# Patient Record
Sex: Female | Born: 1970 | Race: White | Hispanic: No | Marital: Married | Smoking: Never smoker
Health system: Southern US, Community
[De-identification: ages and names within clinical notes are randomized; demographics above are authoritative.]

---

## 2007-11-25 ENCOUNTER — Emergency Department: Payer: Self-pay | Admitting: Emergency Medicine

## 2007-11-28 ENCOUNTER — Ambulatory Visit: Payer: Self-pay | Admitting: Surgery

## 2007-11-29 ENCOUNTER — Ambulatory Visit: Payer: Self-pay | Admitting: Surgery

## 2007-12-03 ENCOUNTER — Ambulatory Visit: Payer: Self-pay | Admitting: Surgery

## 2011-07-17 ENCOUNTER — Ambulatory Visit: Payer: Self-pay | Admitting: Emergency Medicine

## 2015-07-03 DIAGNOSIS — R309 Painful micturition, unspecified: Secondary | ICD-10-CM | POA: Insufficient documentation

## 2015-07-03 DIAGNOSIS — R3911 Hesitancy of micturition: Secondary | ICD-10-CM | POA: Insufficient documentation

## 2017-07-06 ENCOUNTER — Ambulatory Visit: Payer: Self-pay | Admitting: Family Medicine

## 2017-07-06 VITALS — BP 130/62 | HR 85 | Temp 98.2°F | Resp 18 | Ht 64.0 in | Wt 170.0 lb

## 2017-07-06 DIAGNOSIS — Z008 Encounter for other general examination: Secondary | ICD-10-CM

## 2017-07-06 DIAGNOSIS — Z0189 Encounter for other specified special examinations: Principal | ICD-10-CM

## 2017-07-06 LAB — GLUCOSE, POCT (MANUAL RESULT ENTRY): POC Glucose: 99 mg/dL (ref 70–99)

## 2017-07-06 NOTE — Progress Notes (Signed)
Subjective: Annual biometrics screening  Patient presents for her annual biometric screening. Patient has a history of allergic rhinitis.  Patient reports generally eating a well-balanced diet and doing yoga for exercise but that she occasionally eats too much when she does not sleep well.  Patient reports she is trying to work on improving this currently. PCP: Patient's PCP just retired. Patient denies any other issues or concerns.   Review of Systems Unremarkable  Objective  Physical Exam General: Awake, alert and oriented. No acute distress. Well developed, hydrated and nourished. Appears stated age.  HEENT: Supple neck without adenopathy. Sclera is non-icteric. The ear canal is clear without discharge. The tympanic membrane is normal in appearance with normal landmarks and cone of light. Nasal mucosa is pink and moist. Oral mucosa is pink and moist. The pharynx is normal in appearance without tonsillar swelling or exudates.  Skin: Skin in warm, dry and intact without rashes or lesions. Appropriate color for ethnicity. Cardiac: Heart rate and rhythm are normal. No murmurs, gallops, or rubs are auscultated.  Respiratory: The chest wall is symmetric and without deformity. No signs of respiratory distress. Lung sounds are clear in all lobes bilaterally without rales, ronchi, or wheezes.  Neurological: The patient is awake, alert and oriented to person, place, and time with normal speech.  Memory is normal and thought processes intact. No gait abnormalities are appreciated.  Psychiatric: Appropriate mood and affect.   Assessment Annual biometrics screening  Plan  Lipid panel pending. Encouraged routine visits with primary care provider.  Provide patient with resources to establish care with a new primary care provider since her primary care provider recently left the practice. Encouraged eating a healthy, well-balanced diet and exercise. Fasting blood sugar is 99 today.

## 2017-07-07 LAB — LIPID PANEL
Chol/HDL Ratio: 5.2 ratio — ABNORMAL HIGH (ref 0.0–4.4)
Cholesterol, Total: 196 mg/dL (ref 100–199)
HDL: 38 mg/dL — ABNORMAL LOW
LDL Calculated: 136 mg/dL — ABNORMAL HIGH (ref 0–99)
Triglycerides: 111 mg/dL (ref 0–149)
VLDL Cholesterol Cal: 22 mg/dL (ref 5–40)

## 2017-07-09 NOTE — Progress Notes (Signed)
Carollee HerterShannon, Will you call the patient and inform them that their lipid panel came back?  Everything is normal with the exception of her HDL cholesterol, LDL cholesterol, and cholesterol/HDL ratio.  The HDL cholesterol ("good cholesterol") is decreased at 38, normal values are above 39.  The LDL cholesterol ("bad cholesterol") is increased at 136, normal values are below 99.  The cholesterol/HDL ratio is increased at 5.2, normal values are between 0 and 4.4 for women or 0 and 5 from men.  These abnormal values increase their risk for cardiovascular disease.  Please advise the patient to discuss this with their primary care provider at their next regularly scheduled visit.

## 2018-10-07 ENCOUNTER — Other Ambulatory Visit: Payer: Self-pay

## 2018-10-07 ENCOUNTER — Ambulatory Visit (INDEPENDENT_AMBULATORY_CARE_PROVIDER_SITE_OTHER): Payer: Managed Care, Other (non HMO) | Admitting: Urology

## 2018-10-07 ENCOUNTER — Encounter: Payer: Self-pay | Admitting: Urology

## 2018-10-07 ENCOUNTER — Encounter

## 2018-10-07 VITALS — BP 121/68 | Ht 64.0 in | Wt 170.0 lb

## 2018-10-07 DIAGNOSIS — N1330 Unspecified hydronephrosis: Secondary | ICD-10-CM | POA: Diagnosis not present

## 2018-10-07 DIAGNOSIS — R300 Dysuria: Secondary | ICD-10-CM

## 2018-10-07 LAB — MICROSCOPIC EXAMINATION
Bacteria, UA: NONE SEEN
RBC: NONE SEEN /hpf (ref 0–2)

## 2018-10-07 LAB — URINALYSIS, COMPLETE
Bilirubin, UA: NEGATIVE
Glucose, UA: NEGATIVE
Ketones, UA: NEGATIVE
Leukocytes,UA: NEGATIVE
Nitrite, UA: NEGATIVE
Protein,UA: NEGATIVE
RBC, UA: NEGATIVE
Specific Gravity, UA: 1.01 (ref 1.005–1.030)
Urobilinogen, Ur: 0.2 mg/dL (ref 0.2–1.0)
pH, UA: 6 (ref 5.0–7.5)

## 2018-10-07 NOTE — Progress Notes (Signed)
10/07/2018  11:51 AM   Julie Riddle April 07, 1970 [id number]  Referring provider: Nelwyn Salisbury, PA-C [place],  [place] [address]  Chief Complaint  Patient presents with  . Urinary Tract Infection    HPI: I was consulted to assess the patient possible bladder infections.  The details of the history were difficult.  She has been treated for 2 bladder infections in the last 2 months but the cultures were normal.  She took antibiotics once.  She primarily gets a stinging feeling or discomfort in the low back and sometimes in the urethra.  It is quite vague and may be worse when she holds it longer especially at night.  It temporarily lessens after she voids.  She does not get pelvic pain and she has intermittent dyspareunia.  It may be worse after each menstrual cycle.  She voids every 1 or 2 hours and usually gets up 2 or more times at night.  She has a poor flow with hesitancy but does feel empty and is continent  She denies a history of previous GU surgery kidney stones or bladder infections.  No neurologic issues.  She has not had a hysterectomy and bowel movements normal  Modifying factors: There are no other modifying factors  Associated signs and symptoms: There are no other associated signs and symptoms Aggravating and relieving factors: There are no other aggravating or relieving factors Severity: Moderate Duration: Persistent    PMH: No past medical history on file.  Surgical History:   Home Medications:  Allergies as of 10/07/2018      Reactions   Penicillins Hives      Medication List       Accurate as of October 07, 2018 11:51 AM. If you have any questions, ask your nurse or doctor.        cetirizine 10 MG tablet Commonly known as: ZYRTEC       Allergies:  Allergies  Allergen Reactions  . Penicillins Hives    Family History: No family history on file.  Social History:  reports that she has never smoked. She has never used  smokeless tobacco. No history on file for alcohol and drug.  ROS: UROLOGY Frequent Urination?: Yes Hard to postpone urination?: No Burning/pain with urination?: No Get up at night to urinate?: Yes Leakage of urine?: No Urine stream starts and stops?: Yes Trouble starting stream?: Yes Do you have to strain to urinate?: No Blood in urine?: No Urinary tract infection?: No Sexually transmitted disease?: No Injury to kidneys or bladder?: No Painful intercourse?: No Weak stream?: Yes Currently pregnant?: No Vaginal bleeding?: No Last menstrual period?: n  Gastrointestinal Nausea?: No Vomiting?: No Indigestion/heartburn?: No Diarrhea?: No Constipation?: No  Constitutional Fever: No Night sweats?: No Weight loss?: No Fatigue?: No  Skin Skin rash/lesions?: No Itching?: No  Eyes Blurred vision?: No Double vision?: No  Ears/Nose/Throat Sore throat?: No Sinus problems?: No  Hematologic/Lymphatic Swollen glands?: No Easy bruising?: No  Cardiovascular Leg swelling?: No Chest pain?: No  Respiratory Cough?: No Shortness of breath?: No  Endocrine Excessive thirst?: No  Musculoskeletal Back pain?: No Joint pain?: No  Neurological Headaches?: No Dizziness?: No  Psychologic Depression?: No Anxiety?: No  Physical Exam: BP 121/68   Ht 5\' 4"  (1.626 m)   Wt 170 lb (77.1 kg)   BMI 29.18 kg/m   Constitutional:  Alert and oriented, No acute distress. HEENT: [place] AT, moist mucus membranes.  Trachea midline, no masses. Cardiovascular: No clubbing, cyanosis, or  edema. Respiratory: Normal respiratory effort, no increased work of breathing. GI: Abdomen is soft, nontender, nondistended, no abdominal masses GU: No CVA tenderness.  No bladder tenderness.  On pelvic examination she had a grade 1 cystocele but no levator tenderness or bladder tenderness and no stress incontinence Skin: No rashes, bruises or suspicious lesions. Lymph: No cervical or inguinal adenopathy.  Neurologic: Grossly intact, no focal deficits, moving all 4 extremities. Psychiatric: Normal mood and affect.  Laboratory Data: No results found for: WBC, HGB, HCT, MCV, PLT  No results found for: CREATININE  No results found for: PSA  No results found for: TESTOSTERONE  No results found for: HGBA1C  Urinalysis No results found for: COLORURINE, APPEARANCEUR, LABSPEC, PHURINE, GLUCOSEU, HGBUR, BILIRUBINUR, KETONESUR, PROTEINUR, UROBILINOGEN, NITRITE, LEUKOCYTESUR  Pertinent Imaging:   Assessment & Plan: The patient has vague primarily left low back symptoms that may be somewhat intermittent or cyclical.  She has had 2- urine cultures and urine was sent today also but was normal.  I do not think she has interstitial cystitis.  I think if her renal ultrasound is normal the genitourinary system is not the cause of her symptoms.  With the cyclical nature endometriosis might be a distant option.  The role of a CT scan also discussed  We will call her with the CT results.  There are no diagnoses linked to this encounter.  No follow-ups on file.  Martina SinnerScott A Lacoya Wilbanks, MD  [facility] [address], [address] [place], [place] [address] [phone]

## 2018-10-09 LAB — URINE CULTURE: Organism ID, Bacteria: NO GROWTH

## 2018-10-18 ENCOUNTER — Ambulatory Visit: Payer: Managed Care, Other (non HMO)

## 2018-10-28 ENCOUNTER — Other Ambulatory Visit: Payer: Self-pay

## 2018-10-28 ENCOUNTER — Ambulatory Visit: Payer: Self-pay | Admitting: Urology

## 2018-10-28 ENCOUNTER — Ambulatory Visit
Admission: RE | Admit: 2018-10-28 | Discharge: 2018-10-28 | Disposition: A | Discharge status: Home / Self Care | Payer: Managed Care, Other (non HMO) | Source: Ambulatory Visit | Attending: Urology | Admitting: Urology

## 2018-10-28 DIAGNOSIS — N1330 Unspecified hydronephrosis: Secondary | ICD-10-CM | POA: Diagnosis not present

## 2019-11-10 ENCOUNTER — Other Ambulatory Visit: Payer: Self-pay | Admitting: Medical Oncology

## 2019-11-10 DIAGNOSIS — Z1231 Encounter for screening mammogram for malignant neoplasm of breast: Secondary | ICD-10-CM

## 2020-05-21 IMAGING — CT CT ABDOMEN AND PELVIS WITHOUT CONTRAST
2 of 4 series · 16 of 46 positions shown, 18 images · non-contrast
Comparison: 07/17/2011 abdominal ultrasound. Report of an abdominal
CT of 11/16/2006.

CLINICAL DATA: Dysuria and urinary frequency for 3 months. Left
flank pain.

EXAM:
CT ABDOMEN AND PELVIS WITHOUT CONTRAST
TECHNIQUE: Multidetector CT imaging of the abdomen and pelvis was performed
following the standard protocol without IV contrast.

[Series 2: routine abd pelvis without · axial · non-contrast · 0.64mm/px · z∈[-1507,-1107]mm · 13 of 89 slices shown, 15 images]
[im 5/89  soft-tissue]
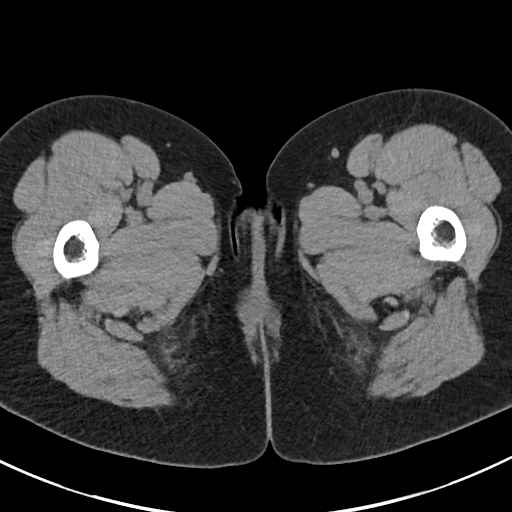
[im 5/89  bone]
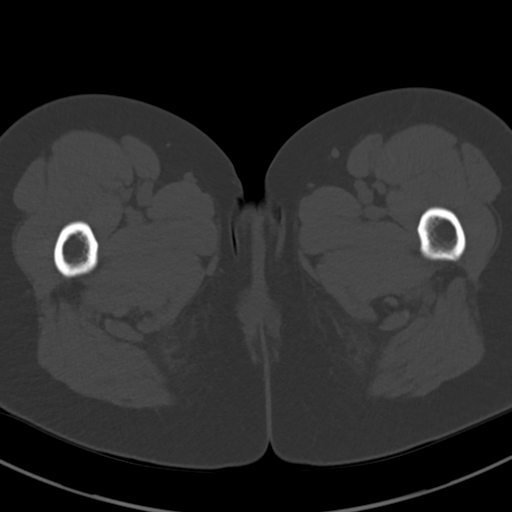
[im 13/89  soft-tissue]
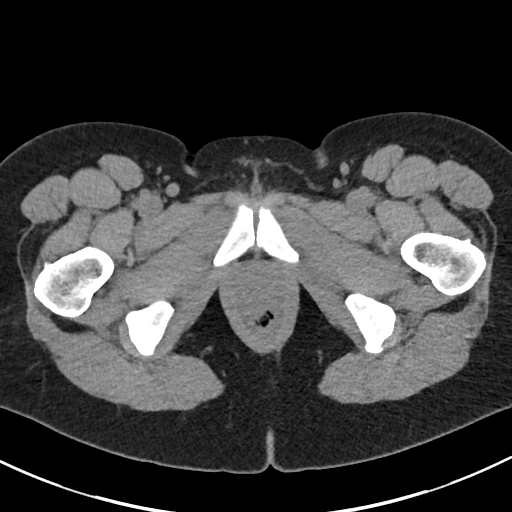
[im 21/89  soft-tissue]
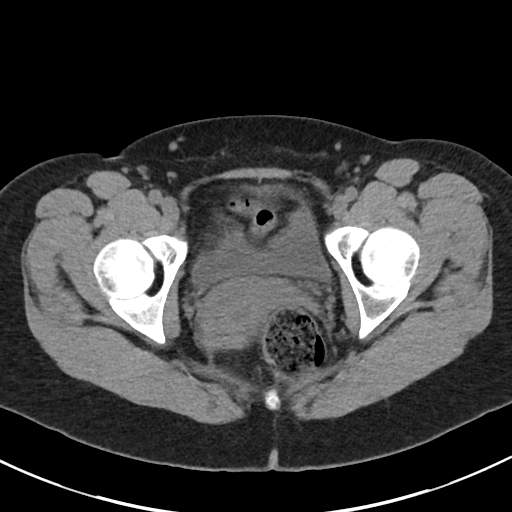
[im 25/89  soft-tissue]
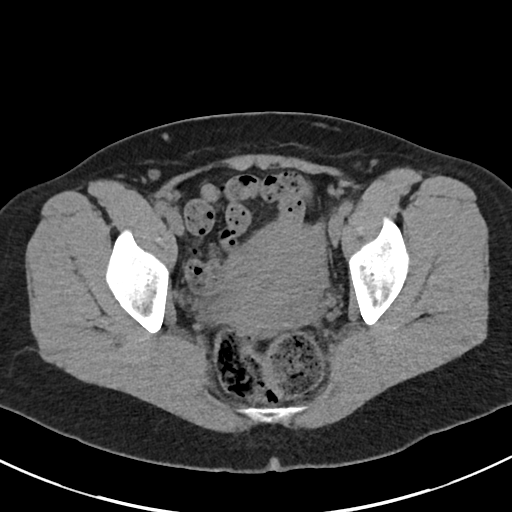
[im 33/89  soft-tissue]
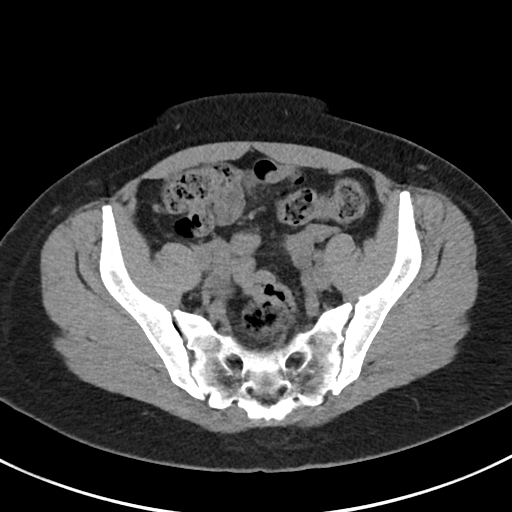
[im 37/89  soft-tissue]
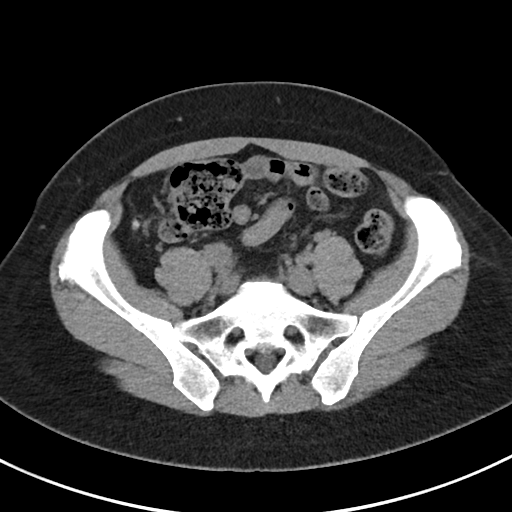
[im 45/89  soft-tissue]
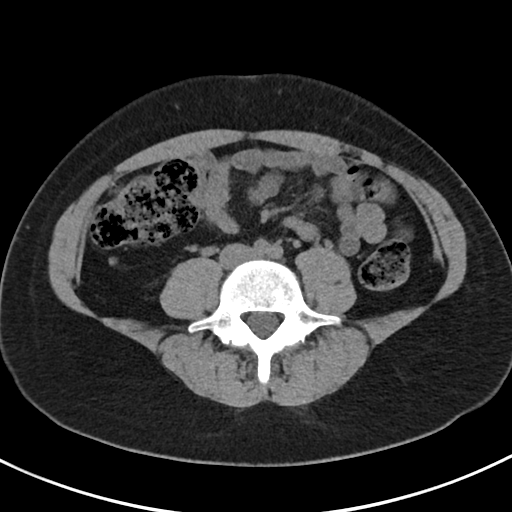
[im 53/89  soft-tissue]
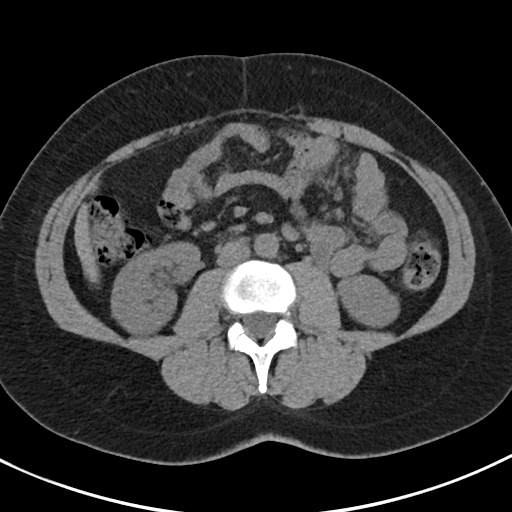
[im 57/89  soft-tissue]
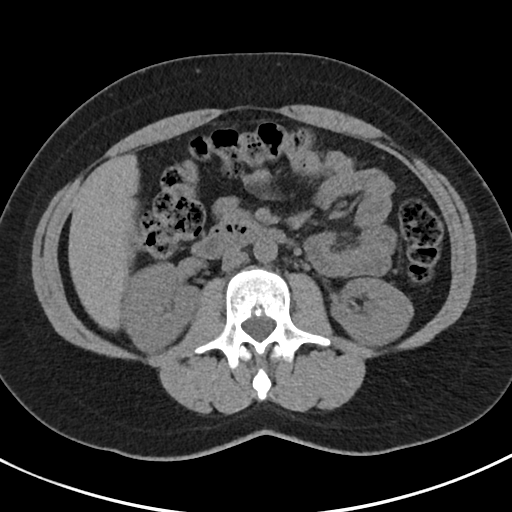
[im 57/89  bone]
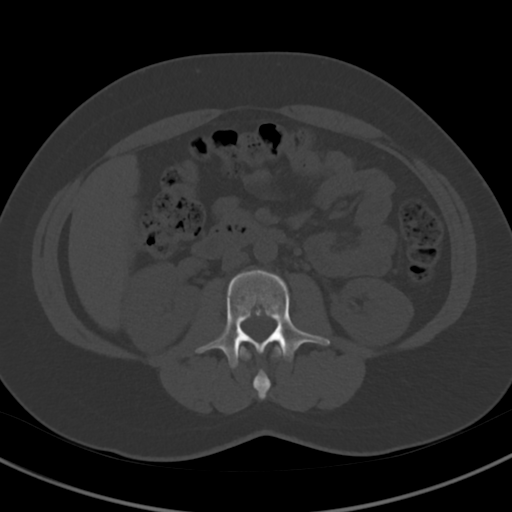
[im 65/89  soft-tissue]
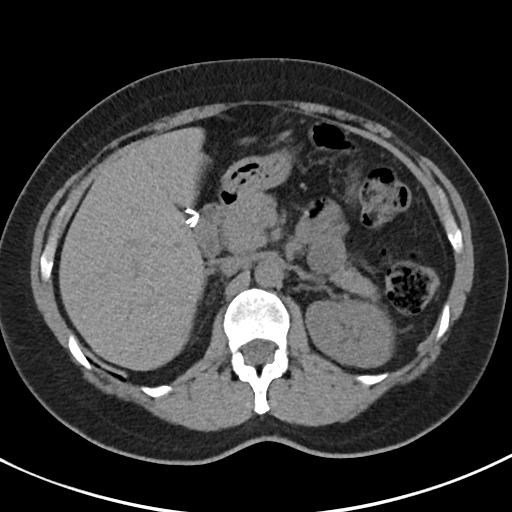
[im 69/89  soft-tissue]
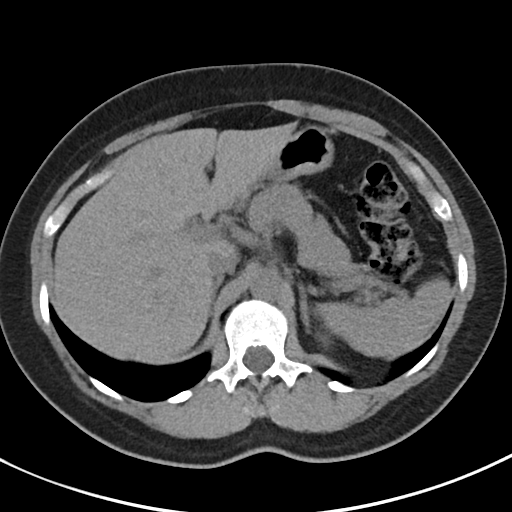
[im 77/89  soft-tissue]
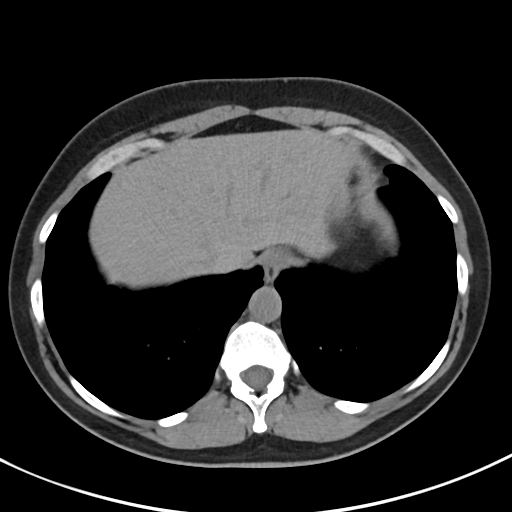
[im 85/89  soft-tissue]
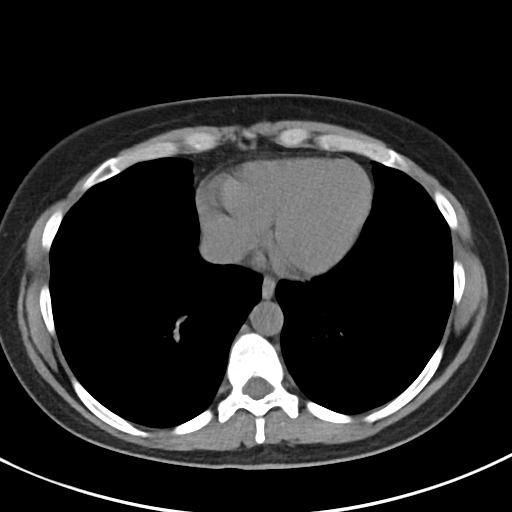

[Series 4: coronal routine abd pelvis without · coronal · non-contrast · 0.64mm/px · 3 of 137 slices shown]
[im 46/137  soft-tissue]
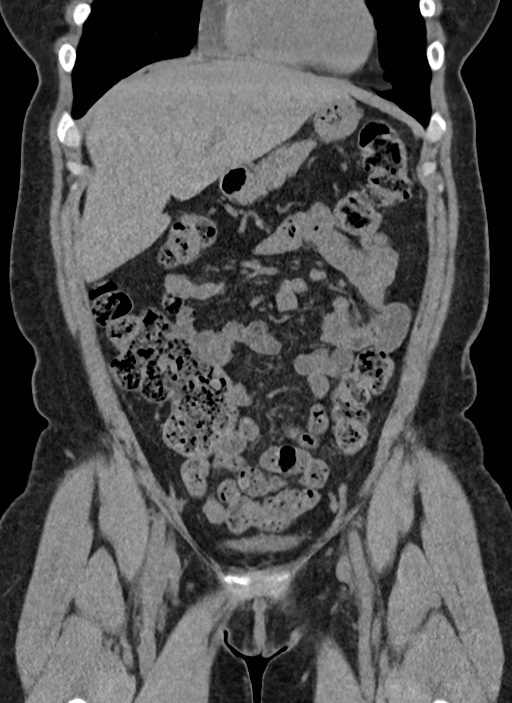
[im 61/137  soft-tissue]
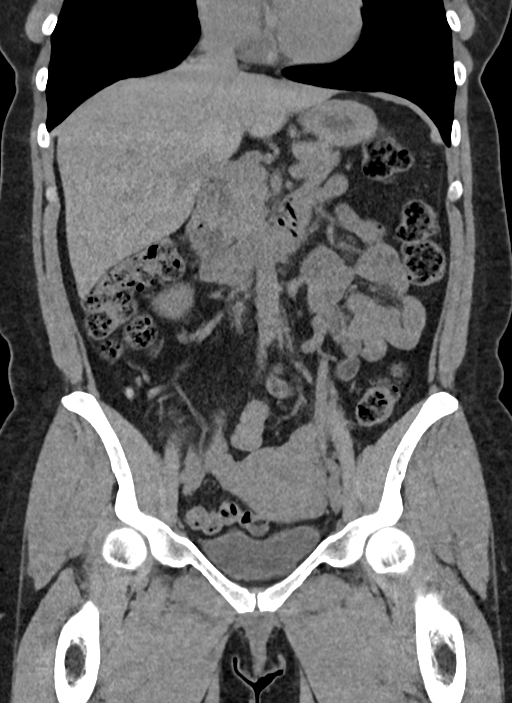
[im 76/137  soft-tissue]
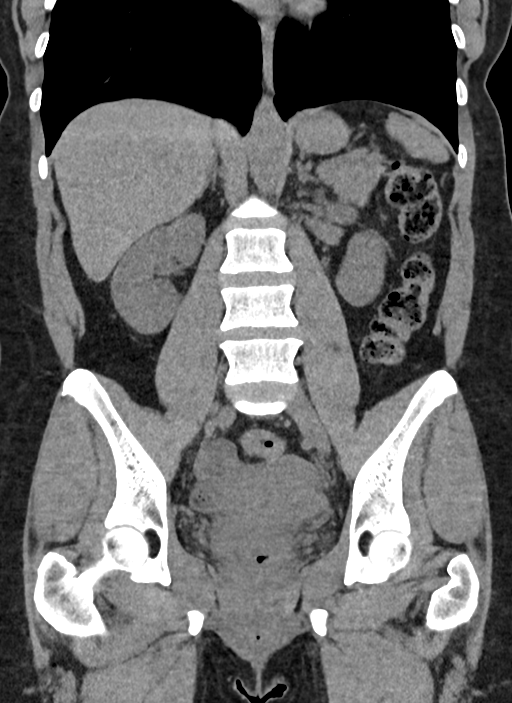

[16 of 46 positions shown; findings below may reference images not displayed]

FINDINGS: Lower chest: Minimal motion degradation. Clear lung bases. Normal
heart size without pericardial or pleural effusion.

Hepatobiliary: Normal liver. Cholecystectomy, without biliary ductal
dilatation.

Pancreas: Normal, without mass or ductal dilatation.

Spleen: Normal in size, without focal abnormality.

Adrenals/Urinary Tract: Normal adrenal glands. No renal calculi or
hydronephrosis. No hydroureter or ureteric calculi. No bladder
calculi.

Stomach/Bowel: Normal stomach, without wall thickening. Moderate
amount of stool within the rectum, descending, and ascending colon.
Normal terminal ileum and appendix. Normal small bowel.

Vascular/Lymphatic: Aortic atherosclerosis. No abdominopelvic
adenopathy.

Reproductive: Normal uterus and adnexa.

Other: No significant free fluid.

Musculoskeletal: No acute osseous abnormality.
IMPRESSION: 1.  No urinary tract calculi or hydronephrosis.
2. Cholecystectomy.
3.  Aortic Atherosclerosis (AH5BT-CI8.8).
4.  Possible constipation.

## 2021-03-28 ENCOUNTER — Ambulatory Visit (INDEPENDENT_AMBULATORY_CARE_PROVIDER_SITE_OTHER): Payer: Managed Care, Other (non HMO)

## 2021-03-28 ENCOUNTER — Encounter: Payer: Self-pay | Admitting: Podiatry

## 2021-03-28 ENCOUNTER — Ambulatory Visit (INDEPENDENT_AMBULATORY_CARE_PROVIDER_SITE_OTHER): Payer: Managed Care, Other (non HMO) | Admitting: Podiatry

## 2021-03-28 ENCOUNTER — Other Ambulatory Visit: Payer: Self-pay

## 2021-03-28 DIAGNOSIS — L603 Nail dystrophy: Secondary | ICD-10-CM | POA: Diagnosis not present

## 2021-03-28 DIAGNOSIS — M722 Plantar fascial fibromatosis: Secondary | ICD-10-CM

## 2021-03-28 DIAGNOSIS — M779 Enthesopathy, unspecified: Secondary | ICD-10-CM

## 2021-03-28 DIAGNOSIS — M25371 Other instability, right ankle: Secondary | ICD-10-CM

## 2021-03-28 DIAGNOSIS — S92251S Displaced fracture of navicular [scaphoid] of right foot, sequela: Secondary | ICD-10-CM

## 2021-03-28 NOTE — Patient Instructions (Signed)

## 2021-03-28 NOTE — Progress Notes (Signed)
°  Subjective:  Patient ID: Julie Riddle, female    DOB: [birth year],  MRN: [id number]  Chief Complaint  Patient presents with   Foot Pain    Patient presents today for left heel pain x 3-4 months, right top of foot and ankle pain x 3-4 weeks from previous injury 1 year ago.  She also wants to discuss nail fungus left toes 3-5    51 y.o. female presents with the above complaint. History confirmed with patient.  She had a bad fall and sprain of the right ankle last year.  She went to [place] in September this made it feel worse after it had been improved.  She was on a period of immobilization in a cam boot prior to her trip after being treated for it.  This also flared the left heel pain to start she thinks maybe [place] fasciitis which she has had on the right side before.  May have fungus in the toes on the left foot  Objective:  Physical Exam: warm, good capillary refill, no trophic changes or ulcerative lesions, normal DP and PT pulses, and normal sensory exam. Left Foot: point tenderness over the heel pad and dystrophic third fourth and fifth toenails Right Foot:  Pain over the lateral ankle over the dorsal navicular ATFL and CFL, no gross instability with maneuvers  No images are attached to the encounter.  Radiographs: Multiple views x-ray of both feet: Left foot plantar heel spur and right foot dorsal navicular avulsion fracture looks to be old in appearance well-corticated Assessment:   1. Plantar fasciitis of left foot   2. Ankle instability, right   3. Closed avulsion fracture of navicular bone of right foot, sequela   4. Nail dystrophy   5. Tendinitis      Plan:  Patient was evaluated and treated and all questions answered.  Discussed the etiology and treatment options for plantar fasciitis including stretching, formal physical therapy, supportive shoegears such as a running shoe or sneaker, pre fabricated orthoses, injection therapy, and oral medications. We also discussed  the role of surgical treatment of this for patients who do not improve after exhausting non-surgical treatment options.  Also discussed that with her sprain she likely had an avulsion fracture of the navicular and has some latent sequela with ligament pain and instability.  I recommend physical therapy for both and she will start this at [facility].  OTC ibuprofen as needed when it is bothersome.  Nail samples taken and sent to [facility] pathology to evaluate for onychomycosis  Return in about 6 weeks (around 05/09/2021) for recheck plantar fasciitis L, ankle instability R.

## 2021-05-09 ENCOUNTER — Ambulatory Visit: Payer: Managed Care, Other (non HMO) | Admitting: Podiatry

## 2022-01-11 ENCOUNTER — Other Ambulatory Visit: Payer: Self-pay | Admitting: Physician Assistant

## 2022-01-11 DIAGNOSIS — Z1231 Encounter for screening mammogram for malignant neoplasm of breast: Secondary | ICD-10-CM

## 2022-02-12 LAB — EXTERNAL GENERIC LAB PROCEDURE

## 2022-03-04 LAB — COLOGUARD: COLOGUARD: NEGATIVE

## 2022-03-04 LAB — EXTERNAL GENERIC LAB PROCEDURE: COLOGUARD: NEGATIVE

## 2023-01-04 ENCOUNTER — Other Ambulatory Visit: Payer: Self-pay | Admitting: Physician Assistant

## 2023-01-04 DIAGNOSIS — Z1231 Encounter for screening mammogram for malignant neoplasm of breast: Secondary | ICD-10-CM

## 2023-04-26 ENCOUNTER — Ambulatory Visit
Admission: RE | Admit: 2023-04-26 | Discharge: 2023-04-26 | Disposition: A | Discharge status: Home / Self Care | Payer: Managed Care, Other (non HMO) | Source: Ambulatory Visit | Attending: Physician Assistant | Admitting: Physician Assistant

## 2023-04-26 DIAGNOSIS — Z1231 Encounter for screening mammogram for malignant neoplasm of breast: Secondary | ICD-10-CM | POA: Insufficient documentation

## 2023-08-01 ENCOUNTER — Other Ambulatory Visit: Payer: Self-pay | Admitting: Family

## 2023-08-01 DIAGNOSIS — M79604 Pain in right leg: Secondary | ICD-10-CM

## 2023-08-01 DIAGNOSIS — I781 Nevus, non-neoplastic: Secondary | ICD-10-CM

## 2023-08-08 ENCOUNTER — Ambulatory Visit

## 2023-08-15 ENCOUNTER — Ambulatory Visit
Admission: RE | Admit: 2023-08-15 | Discharge: 2023-08-15 | Disposition: A | Discharge status: Home / Self Care | Source: Ambulatory Visit | Attending: Family | Admitting: Family

## 2023-08-15 DIAGNOSIS — I781 Nevus, non-neoplastic: Secondary | ICD-10-CM | POA: Insufficient documentation

## 2023-08-15 DIAGNOSIS — M79604 Pain in right leg: Secondary | ICD-10-CM | POA: Insufficient documentation

## 2023-08-16 ENCOUNTER — Other Ambulatory Visit: Payer: Self-pay

## 2023-08-16 DIAGNOSIS — R102 Pelvic and perineal pain: Secondary | ICD-10-CM
